# Patient Record
Sex: Male | Born: 2000 | Race: White | Hispanic: No | Marital: Single
Health system: Southern US, Community
[De-identification: ages and names within clinical notes are randomized; demographics above are authoritative.]

## PROBLEM LIST (undated history)
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
## 2019-09-23 DIAGNOSIS — R55 Syncope and collapse: Secondary | ICD-10-CM

## 2019-11-15 ENCOUNTER — Encounter: Payer: Self-pay | Admitting: Neurology

## 2019-11-15 ENCOUNTER — Ambulatory Visit: Payer: Self-pay | Admitting: Neurology

## 2020-01-29 ENCOUNTER — Emergency Department (HOSPITAL_COMMUNITY): Payer: Worker's Compensation

## 2020-01-29 ENCOUNTER — Inpatient Hospital Stay (HOSPITAL_COMMUNITY)
Admission: EM | Admit: 2020-01-29 | Discharge: 2020-01-31 | DRG: 472 | Disposition: A | Discharge status: Home / Self Care | Payer: Worker's Compensation | Attending: Neurological Surgery | Admitting: Neurological Surgery

## 2020-01-29 ENCOUNTER — Other Ambulatory Visit: Payer: Self-pay

## 2020-01-29 DIAGNOSIS — S12500A Unspecified displaced fracture of sixth cervical vertebra, initial encounter for closed fracture: Secondary | ICD-10-CM | POA: Diagnosis present

## 2020-01-29 DIAGNOSIS — S27329A Contusion of lung, unspecified, initial encounter: Secondary | ICD-10-CM | POA: Diagnosis present

## 2020-01-29 DIAGNOSIS — Y92410 Unspecified street and highway as the place of occurrence of the external cause: Secondary | ICD-10-CM

## 2020-01-29 DIAGNOSIS — S12600A Unspecified displaced fracture of seventh cervical vertebra, initial encounter for closed fracture: Principal | ICD-10-CM | POA: Diagnosis present

## 2020-01-29 DIAGNOSIS — M5410 Radiculopathy, site unspecified: Secondary | ICD-10-CM | POA: Diagnosis present

## 2020-01-29 DIAGNOSIS — Z23 Encounter for immunization: Secondary | ICD-10-CM

## 2020-01-29 DIAGNOSIS — S270XXA Traumatic pneumothorax, initial encounter: Secondary | ICD-10-CM | POA: Diagnosis present

## 2020-01-29 DIAGNOSIS — S129XXA Fracture of neck, unspecified, initial encounter: Secondary | ICD-10-CM | POA: Diagnosis present

## 2020-01-29 DIAGNOSIS — Z419 Encounter for procedure for purposes other than remedying health state, unspecified: Secondary | ICD-10-CM

## 2020-01-29 DIAGNOSIS — Z20822 Contact with and (suspected) exposure to covid-19: Secondary | ICD-10-CM | POA: Diagnosis present

## 2020-01-29 DIAGNOSIS — M79601 Pain in right arm: Secondary | ICD-10-CM | POA: Diagnosis present

## 2020-01-29 DIAGNOSIS — S12690A Other displaced fracture of seventh cervical vertebra, initial encounter for closed fracture: Secondary | ICD-10-CM

## 2020-01-29 DIAGNOSIS — M4802 Spinal stenosis, cervical region: Secondary | ICD-10-CM | POA: Diagnosis present

## 2020-01-29 LAB — I-STAT CHEM 8, ED
BUN: 10 mg/dL (ref 6–20)
Calcium, Ion: 1.13 mmol/L — ABNORMAL LOW (ref 1.15–1.40)
Chloride: 107 mmol/L (ref 98–111)
Creatinine, Ser: 0.9 mg/dL (ref 0.61–1.24)
Glucose, Bld: 83 mg/dL (ref 70–99)
HCT: 41 % (ref 39.0–52.0)
Hemoglobin: 13.9 g/dL (ref 13.0–17.0)
Potassium: 3.2 mmol/L — ABNORMAL LOW (ref 3.5–5.1)
Sodium: 140 mmol/L (ref 135–145)
TCO2: 19 mmol/L — ABNORMAL LOW (ref 22–32)

## 2020-01-29 MED ORDER — TETANUS-DIPHTH-ACELL PERTUSSIS 5-2.5-18.5 LF-MCG/0.5 IM SUSP
0.5000 mL | Freq: Once | INTRAMUSCULAR | Status: AC
  Administered 2020-01-29: 0.5 mL via INTRAMUSCULAR
  Filled 2020-01-29: qty 0.5

## 2020-01-29 MED ORDER — IOHEXOL 300 MG/ML  SOLN
100.0000 mL | Freq: Once | INTRAMUSCULAR | Status: AC | PRN
  Administered 2020-01-29: 100 mL via INTRAVENOUS

## 2020-01-29 MED ORDER — MORPHINE SULFATE (PF) 4 MG/ML IV SOLN
4.0000 mg | Freq: Once | INTRAVENOUS | Status: AC
  Administered 2020-01-29: 4 mg via INTRAVENOUS
  Filled 2020-01-29: qty 1

## 2020-01-29 MED ORDER — FENTANYL CITRATE (PF) 100 MCG/2ML IJ SOLN
50.0000 ug | Freq: Once | INTRAMUSCULAR | Status: AC
  Administered 2020-01-29: 50 ug via INTRAVENOUS
  Filled 2020-01-29: qty 2

## 2020-01-29 NOTE — Progress Notes (Signed)
   01/29/20 1900  Clinical Encounter Type  Visited With Patient and family together  Visit Type Social support  Referral From Nurse  Consult/Referral To Chaplain  Spiritual Encounters  Spiritual Needs Emotional  The chaplain responded to trauma page. The patient was having a CT scan. The chaplain spoke with the patient's mother Lawanna Kobus). The mother was nervous and anxious. The chaplain provided social and emotional support until the patient arrived back to the room. The chaplain will follow up as needed
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
## 2020-01-29 NOTE — ED Notes (Signed)
Pt transported to MRI 
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
## 2020-01-30 ENCOUNTER — Inpatient Hospital Stay (HOSPITAL_COMMUNITY): Payer: Worker's Compensation | Admitting: Certified Registered Nurse Anesthetist

## 2020-01-30 ENCOUNTER — Encounter (HOSPITAL_COMMUNITY): Payer: Self-pay | Admitting: Neurological Surgery

## 2020-01-30 ENCOUNTER — Encounter (HOSPITAL_COMMUNITY)
Admission: EM | Disposition: A | Discharge status: Home / Self Care | Payer: Self-pay | Source: Home / Self Care | Attending: Neurological Surgery

## 2020-01-30 ENCOUNTER — Inpatient Hospital Stay (HOSPITAL_COMMUNITY): Payer: Worker's Compensation

## 2020-01-30 DIAGNOSIS — S129XXA Fracture of neck, unspecified, initial encounter: Secondary | ICD-10-CM | POA: Diagnosis present

## 2020-01-30 DIAGNOSIS — M79601 Pain in right arm: Secondary | ICD-10-CM | POA: Diagnosis present

## 2020-01-30 DIAGNOSIS — Z23 Encounter for immunization: Secondary | ICD-10-CM | POA: Diagnosis not present

## 2020-01-30 DIAGNOSIS — S12600A Unspecified displaced fracture of seventh cervical vertebra, initial encounter for closed fracture: Secondary | ICD-10-CM | POA: Diagnosis present

## 2020-01-30 DIAGNOSIS — S12500A Unspecified displaced fracture of sixth cervical vertebra, initial encounter for closed fracture: Secondary | ICD-10-CM | POA: Diagnosis present

## 2020-01-30 DIAGNOSIS — M4802 Spinal stenosis, cervical region: Secondary | ICD-10-CM | POA: Diagnosis present

## 2020-01-30 DIAGNOSIS — S270XXA Traumatic pneumothorax, initial encounter: Secondary | ICD-10-CM | POA: Diagnosis not present

## 2020-01-30 DIAGNOSIS — M5410 Radiculopathy, site unspecified: Secondary | ICD-10-CM | POA: Diagnosis present

## 2020-01-30 DIAGNOSIS — Y92410 Unspecified street and highway as the place of occurrence of the external cause: Secondary | ICD-10-CM | POA: Diagnosis not present

## 2020-01-30 DIAGNOSIS — Z20822 Contact with and (suspected) exposure to covid-19: Secondary | ICD-10-CM | POA: Diagnosis present

## 2020-01-30 DIAGNOSIS — S27329A Contusion of lung, unspecified, initial encounter: Secondary | ICD-10-CM | POA: Diagnosis present

## 2020-01-30 HISTORY — PX: ANTERIOR CERVICAL DECOMP/DISCECTOMY FUSION: SHX1161

## 2020-01-30 LAB — RESPIRATORY PANEL BY RT PCR (FLU A&B, COVID)
Influenza A by PCR: NEGATIVE
Influenza B by PCR: NEGATIVE
SARS Coronavirus 2 by RT PCR: NEGATIVE

## 2020-01-30 SURGERY — ANTERIOR CERVICAL DECOMPRESSION/DISCECTOMY FUSION 2 LEVELS
Anesthesia: General | Site: Neck

## 2020-01-30 MED ORDER — VASOPRESSIN 20 UNIT/ML IV SOLN
INTRAVENOUS | Status: AC
  Filled 2020-01-30: qty 1

## 2020-01-30 MED ORDER — SUGAMMADEX SODIUM 200 MG/2ML IV SOLN
INTRAVENOUS | Status: DC | PRN
  Administered 2020-01-30: 200 mg via INTRAVENOUS

## 2020-01-30 MED ORDER — MENTHOL 3 MG MT LOZG: 1.0000 | LOZENGE | OROMUCOSAL | Status: DC | PRN

## 2020-01-30 MED ORDER — SODIUM CHLORIDE 0.9 % IV SOLN: INTRAVENOUS | Status: DC

## 2020-01-30 MED ORDER — SODIUM CHLORIDE 0.9 % IV SOLN: 250.0000 mL | INTRAVENOUS | Status: DC

## 2020-01-30 MED ORDER — THROMBIN 5000 UNITS EX SOLR
CUTANEOUS | Status: AC
  Filled 2020-01-30: qty 5000

## 2020-01-30 MED ORDER — OXYCODONE HCL 5 MG PO TABS: 5.0000 mg | ORAL_TABLET | ORAL | Status: DC | PRN

## 2020-01-30 MED ORDER — LIDOCAINE-EPINEPHRINE 1 %-1:100000 IJ SOLN
INTRAMUSCULAR | Status: DC | PRN
  Administered 2020-01-30: 5 mL via INTRADERMAL

## 2020-01-30 MED ORDER — FENTANYL CITRATE (PF) 250 MCG/5ML IJ SOLN
INTRAMUSCULAR | Status: DC | PRN
Start: 2020-01-30 — End: 2020-01-30
  Administered 2020-01-30 (×2): 50 ug via INTRAVENOUS

## 2020-01-30 MED ORDER — THROMBIN 5000 UNITS EX SOLR
OROMUCOSAL | Status: DC | PRN
  Administered 2020-01-30: 5 mL via TOPICAL

## 2020-01-30 MED ORDER — PROPOFOL 10 MG/ML IV BOLUS
INTRAVENOUS | Status: DC | PRN
  Administered 2020-01-30: 120 mg via INTRAVENOUS
  Administered 2020-01-30: 30 mg via INTRAVENOUS

## 2020-01-30 MED ORDER — CHLORHEXIDINE GLUCONATE 0.12 % MT SOLN: 15.0000 mL | Freq: Once | OROMUCOSAL | Status: AC

## 2020-01-30 MED ORDER — LIDOCAINE 2% (20 MG/ML) 5 ML SYRINGE
INTRAMUSCULAR | Status: DC | PRN
  Administered 2020-01-30: 40 mg via INTRAVENOUS

## 2020-01-30 MED ORDER — ORAL CARE MOUTH RINSE: 15.0000 mL | Freq: Once | OROMUCOSAL | Status: AC

## 2020-01-30 MED ORDER — MIDAZOLAM HCL 2 MG/2ML IJ SOLN
INTRAMUSCULAR | Status: AC
  Filled 2020-01-30: qty 2

## 2020-01-30 MED ORDER — CYCLOBENZAPRINE HCL 10 MG PO TABS
10.0000 mg | ORAL_TABLET | Freq: Three times a day (TID) | ORAL | Status: DC | PRN
  Administered 2020-01-30 – 2020-01-31 (×2): 10 mg via ORAL
  Filled 2020-01-30 (×2): qty 1

## 2020-01-30 MED ORDER — PHENOL 1.4 % MT LIQD
1.0000 | OROMUCOSAL | Status: DC | PRN
  Administered 2020-01-30: 1 via OROMUCOSAL
  Filled 2020-01-30: qty 177

## 2020-01-30 MED ORDER — ALBUMIN HUMAN 5 % IV SOLN: INTRAVENOUS | Status: DC | PRN

## 2020-01-30 MED ORDER — SODIUM CHLORIDE 0.9% FLUSH: 3.0000 mL | INTRAVENOUS | Status: DC | PRN

## 2020-01-30 MED ORDER — PROPOFOL 10 MG/ML IV BOLUS
INTRAVENOUS | Status: AC
  Filled 2020-01-30: qty 20

## 2020-01-30 MED ORDER — EPHEDRINE SULFATE 50 MG/ML IJ SOLN
INTRAMUSCULAR | Status: DC | PRN
  Administered 2020-01-30: 5 mg via INTRAVENOUS

## 2020-01-30 MED ORDER — LACTATED RINGERS IV SOLN: INTRAVENOUS | Status: DC

## 2020-01-30 MED ORDER — DEXAMETHASONE SODIUM PHOSPHATE 10 MG/ML IJ SOLN
INTRAMUSCULAR | Status: DC | PRN
  Administered 2020-01-30: 10 mg via INTRAVENOUS

## 2020-01-30 MED ORDER — VANCOMYCIN HCL 1000 MG IV SOLR
INTRAVENOUS | Status: DC | PRN
  Administered 2020-01-30: 1000 mg via INTRAVENOUS

## 2020-01-30 MED ORDER — PHENYLEPHRINE 40 MCG/ML (10ML) SYRINGE FOR IV PUSH (FOR BLOOD PRESSURE SUPPORT)
PREFILLED_SYRINGE | INTRAVENOUS | Status: AC
  Filled 2020-01-30: qty 30

## 2020-01-30 MED ORDER — HYDROMORPHONE HCL 1 MG/ML IJ SOLN
0.4000 mg | INTRAMUSCULAR | Status: DC | PRN
  Administered 2020-01-30 – 2020-01-31 (×4): 0.4 mg via INTRAVENOUS
  Filled 2020-01-30 (×4): qty 1

## 2020-01-30 MED ORDER — PHENOL 1.4 % MT LIQD: 1.0000 | OROMUCOSAL | Status: DC | PRN

## 2020-01-30 MED ORDER — ONDANSETRON HCL 4 MG PO TABS: 4.0000 mg | ORAL_TABLET | Freq: Four times a day (QID) | ORAL | Status: DC | PRN

## 2020-01-30 MED ORDER — NOREPINEPHRINE 4 MG/250ML-% IV SOLN: 0.0000 ug/min | INTRAVENOUS | Status: DC

## 2020-01-30 MED ORDER — EPHEDRINE 5 MG/ML INJ
INTRAVENOUS | Status: AC
  Filled 2020-01-30: qty 20

## 2020-01-30 MED ORDER — SODIUM CHLORIDE 0.9% FLUSH
3.0000 mL | Freq: Two times a day (BID) | INTRAVENOUS | Status: DC
  Administered 2020-01-30 (×2): 3 mL via INTRAVENOUS

## 2020-01-30 MED ORDER — ACETAMINOPHEN 650 MG RE SUPP: 650.0000 mg | RECTAL | Status: DC | PRN

## 2020-01-30 MED ORDER — VANCOMYCIN HCL IN DEXTROSE 1-5 GM/200ML-% IV SOLN
INTRAVENOUS | Status: AC
  Filled 2020-01-30: qty 200

## 2020-01-30 MED ORDER — MIDAZOLAM HCL 2 MG/2ML IJ SOLN
INTRAMUSCULAR | Status: DC | PRN
  Administered 2020-01-30: 2 mg via INTRAVENOUS

## 2020-01-30 MED ORDER — OXYCODONE HCL 5 MG PO TABS
10.0000 mg | ORAL_TABLET | ORAL | Status: DC | PRN
  Administered 2020-01-30 – 2020-01-31 (×2): 10 mg via ORAL
  Filled 2020-01-30 (×2): qty 2

## 2020-01-30 MED ORDER — ROCURONIUM BROMIDE 10 MG/ML (PF) SYRINGE
PREFILLED_SYRINGE | INTRAVENOUS | Status: DC | PRN
  Administered 2020-01-30: 20 mg via INTRAVENOUS
  Administered 2020-01-30: 50 mg via INTRAVENOUS

## 2020-01-30 MED ORDER — DEXMEDETOMIDINE (PRECEDEX) IN NS 20 MCG/5ML (4 MCG/ML) IV SYRINGE
PREFILLED_SYRINGE | INTRAVENOUS | Status: DC | PRN
  Administered 2020-01-30: 8 ug via INTRAVENOUS

## 2020-01-30 MED ORDER — HEPARIN SODIUM (PORCINE) 5000 UNIT/ML IJ SOLN: 5000.0000 [IU] | Freq: Three times a day (TID) | INTRAMUSCULAR | Status: DC

## 2020-01-30 MED ORDER — LIDOCAINE-EPINEPHRINE 1 %-1:100000 IJ SOLN
INTRAMUSCULAR | Status: AC
  Filled 2020-01-30: qty 1

## 2020-01-30 MED ORDER — ONDANSETRON HCL 4 MG/2ML IJ SOLN: 4.0000 mg | Freq: Four times a day (QID) | INTRAMUSCULAR | Status: DC | PRN

## 2020-01-30 MED ORDER — DEXMEDETOMIDINE (PRECEDEX) IN NS 20 MCG/5ML (4 MCG/ML) IV SYRINGE
PREFILLED_SYRINGE | INTRAVENOUS | Status: AC
  Filled 2020-01-30: qty 5

## 2020-01-30 MED ORDER — DOCUSATE SODIUM 100 MG PO CAPS
100.0000 mg | ORAL_CAPSULE | Freq: Two times a day (BID) | ORAL | Status: DC
  Administered 2020-01-30 – 2020-01-31 (×3): 100 mg via ORAL
  Filled 2020-01-30 (×3): qty 1

## 2020-01-30 MED ORDER — VASOPRESSIN 20 UNIT/ML IV SOLN
INTRAVENOUS | Status: DC | PRN
  Administered 2020-01-30: 1 [IU] via INTRAVENOUS

## 2020-01-30 MED ORDER — PHENYLEPHRINE HCL (PRESSORS) 10 MG/ML IV SOLN
INTRAVENOUS | Status: DC | PRN
  Administered 2020-01-30: 80 ug via INTRAVENOUS
  Administered 2020-01-30: 160 ug via INTRAVENOUS
  Administered 2020-01-30 (×2): 80 ug via INTRAVENOUS
  Administered 2020-01-30: 160 ug via INTRAVENOUS
  Administered 2020-01-30 (×3): 80 ug via INTRAVENOUS

## 2020-01-30 MED ORDER — ONDANSETRON HCL 4 MG/2ML IJ SOLN
INTRAMUSCULAR | Status: DC | PRN
  Administered 2020-01-30: 4 mg via INTRAVENOUS

## 2020-01-30 MED ORDER — ONDANSETRON HCL 4 MG/2ML IJ SOLN
4.0000 mg | Freq: Four times a day (QID) | INTRAMUSCULAR | Status: DC | PRN
  Administered 2020-01-30 (×2): 4 mg via INTRAVENOUS
  Filled 2020-01-30 (×2): qty 2

## 2020-01-30 MED ORDER — PHENYLEPHRINE HCL-NACL 10-0.9 MG/250ML-% IV SOLN
INTRAVENOUS | Status: DC | PRN
  Administered 2020-01-30: 25 ug/min via INTRAVENOUS

## 2020-01-30 MED ORDER — HYDROMORPHONE HCL 1 MG/ML IJ SOLN
1.0000 mg | INTRAMUSCULAR | Status: DC | PRN
  Administered 2020-01-30 (×2): 1 mg via INTRAVENOUS
  Filled 2020-01-30 (×2): qty 1

## 2020-01-30 MED ORDER — POLYETHYLENE GLYCOL 3350 17 G PO PACK: 17.0000 g | PACK | Freq: Every day | ORAL | Status: DC | PRN

## 2020-01-30 MED ORDER — FENTANYL CITRATE (PF) 250 MCG/5ML IJ SOLN
INTRAMUSCULAR | Status: AC
  Filled 2020-01-30: qty 5

## 2020-01-30 MED ORDER — 0.9 % SODIUM CHLORIDE (POUR BTL) OPTIME
TOPICAL | Status: DC | PRN
  Administered 2020-01-30: 1000 mL

## 2020-01-30 MED ORDER — CHLORHEXIDINE GLUCONATE 0.12 % MT SOLN
OROMUCOSAL | Status: AC
  Administered 2020-01-30: 15 mL via OROMUCOSAL
  Filled 2020-01-30: qty 15

## 2020-01-30 MED ORDER — SODIUM CHLORIDE 0.9% FLUSH: 3.0000 mL | Freq: Two times a day (BID) | INTRAVENOUS | Status: DC

## 2020-01-30 MED ORDER — ACETAMINOPHEN 325 MG PO TABS: 650.0000 mg | ORAL_TABLET | ORAL | Status: DC | PRN

## 2020-01-30 SURGICAL SUPPLY — 58 items
BAG DECANTER FOR FLEXI CONT (MISCELLANEOUS) ×3 IMPLANT
BAND RUBBER #18 3X1/16 STRL (MISCELLANEOUS) ×6 IMPLANT
BLADE CLIPPER SURG (BLADE) IMPLANT
BLADE SURG 11 STRL SS (BLADE) ×3 IMPLANT
BUR MATCHSTICK NEURO 3.0 LAGG (BURR) ×3 IMPLANT
CANISTER SUCT 3000ML PPV (MISCELLANEOUS) ×3 IMPLANT
COVER WAND RF STERILE (DRAPES) ×3 IMPLANT
DECANTER SPIKE VIAL GLASS SM (MISCELLANEOUS) ×3 IMPLANT
DERMABOND ADVANCED (GAUZE/BANDAGES/DRESSINGS) ×2
DERMABOND ADVANCED .7 DNX12 (GAUZE/BANDAGES/DRESSINGS) ×1 IMPLANT
DRAPE C-ARM 42X72 X-RAY (DRAPES) ×6 IMPLANT
DRAPE HALF SHEET 40X57 (DRAPES) IMPLANT
DRAPE LAPAROTOMY 100X72 PEDS (DRAPES) ×3 IMPLANT
DRAPE MICROSCOPE LEICA (MISCELLANEOUS) ×3 IMPLANT
DURAPREP 6ML APPLICATOR 50/CS (WOUND CARE) ×3 IMPLANT
ELECT COATED BLADE 2.86 ST (ELECTRODE) ×3 IMPLANT
ELECT REM PT RETURN 9FT ADLT (ELECTROSURGICAL) ×3
ELECTRODE REM PT RTRN 9FT ADLT (ELECTROSURGICAL) ×1 IMPLANT
GAUZE 4X4 16PLY RFD (DISPOSABLE) IMPLANT
GLOVE BIO SURGEON STRL SZ 6.5 (GLOVE) ×2 IMPLANT
GLOVE BIO SURGEON STRL SZ7.5 (GLOVE) ×3 IMPLANT
GLOVE BIO SURGEONS STRL SZ 6.5 (GLOVE) ×1
GLOVE BIOGEL PI IND STRL 6.5 (GLOVE) ×1 IMPLANT
GLOVE BIOGEL PI IND STRL 7.5 (GLOVE) ×2 IMPLANT
GLOVE BIOGEL PI INDICATOR 6.5 (GLOVE) ×2
GLOVE BIOGEL PI INDICATOR 7.5 (GLOVE) ×4
GLOVE EXAM NITRILE LRG STRL (GLOVE) IMPLANT
GLOVE EXAM NITRILE XL STR (GLOVE) IMPLANT
GLOVE EXAM NITRILE XS STR PU (GLOVE) IMPLANT
GOWN STRL REUS W/ TWL LRG LVL3 (GOWN DISPOSABLE) ×2 IMPLANT
GOWN STRL REUS W/ TWL XL LVL3 (GOWN DISPOSABLE) IMPLANT
GOWN STRL REUS W/TWL 2XL LVL3 (GOWN DISPOSABLE) IMPLANT
GOWN STRL REUS W/TWL LRG LVL3 (GOWN DISPOSABLE) ×4
GOWN STRL REUS W/TWL XL LVL3 (GOWN DISPOSABLE)
HEMOSTAT POWDER KIT SURGIFOAM (HEMOSTASIS) ×3 IMPLANT
KIT BASIN OR (CUSTOM PROCEDURE TRAY) ×3 IMPLANT
KIT TURNOVER KIT B (KITS) ×3 IMPLANT
NEEDLE HYPO 22GX1.5 SAFETY (NEEDLE) ×3 IMPLANT
NEEDLE SPNL 18GX3.5 QUINCKE PK (NEEDLE) ×3 IMPLANT
NS IRRIG 1000ML POUR BTL (IV SOLUTION) ×3 IMPLANT
PACK LAMINECTOMY NEURO (CUSTOM PROCEDURE TRAY) ×3 IMPLANT
PAD ARMBOARD 7.5X6 YLW CONV (MISCELLANEOUS) ×9 IMPLANT
PIN DISTRACTION 14MM (PIN) IMPLANT
PLATE ELITE 21MM (Plate) ×3 IMPLANT
SCREW ST 13X4XST VA NS SPNE (Screw) ×4 IMPLANT
SCREW ST VAR 4 ATL (Screw) ×8 IMPLANT
SPACER BONE CORNERSTONE 6X14 (Orthopedic Implant) ×3 IMPLANT
SPONGE INTESTINAL PEANUT (DISPOSABLE) ×3 IMPLANT
SPONGE SURGIFOAM ABS GEL SZ50 (HEMOSTASIS) IMPLANT
STAPLER VISISTAT 35W (STAPLE) IMPLANT
SUT MNCRL AB 3-0 PS2 18 (SUTURE) IMPLANT
SUT MON AB 3-0 SH 27 (SUTURE) ×2
SUT MON AB 3-0 SH27 (SUTURE) ×1 IMPLANT
SUT VIC AB 3-0 SH 8-18 (SUTURE) ×6 IMPLANT
TAPE CLOTH 3X10 TAN LF (GAUZE/BANDAGES/DRESSINGS) ×3 IMPLANT
TOWEL GREEN STERILE (TOWEL DISPOSABLE) ×3 IMPLANT
TOWEL GREEN STERILE FF (TOWEL DISPOSABLE) ×3 IMPLANT
WATER STERILE IRR 1000ML POUR (IV SOLUTION) ×3 IMPLANT

## 2020-01-30 NOTE — ED Notes (Signed)
MS Breakfast Ordered 

## 2020-01-30 NOTE — Anesthesia Preprocedure Evaluation (Addendum)
Anesthesia Evaluation  Patient identified by MRN, date of birth, ID band Patient awake    Reviewed: Allergy & Precautions, NPO status , Patient's Chart, lab work & pertinent test results  Airway Mallampati: II  TM Distance: >3 FB Neck ROM: Limited    Dental  (+) Teeth Intact, Dental Advisory Given   Pulmonary    breath sounds clear to auscultation       Cardiovascular  Rhythm:Regular Rate:Normal     Neuro/Psych    GI/Hepatic   Endo/Other    Renal/GU      Musculoskeletal   Abdominal   Peds  Hematology   Anesthesia Other Findings   Reproductive/Obstetrics                             Anesthesia Physical Anesthesia Plan  ASA: III and emergent  Anesthesia Plan: General   Post-op Pain Management:    Induction: Intravenous  PONV Risk Score and Plan: Ondansetron and Dexamethasone  Airway Management Planned: Oral ETT and Video Laryngoscope Planned  Additional Equipment:   Intra-op Plan:   Post-operative Plan: Extubation in OR  Informed Consent: I have reviewed the patients History and Physical, chart, labs and discussed the procedure including the risks, benefits and alternatives for the proposed anesthesia with the patient or authorized representative who has indicated his/her understanding and acceptance.     Dental advisory given  Plan Discussed with: CRNA and Anesthesiologist  Anesthesia Plan Comments:        Anesthesia Quick Evaluation

## 2020-01-30 NOTE — Plan of Care (Signed)
Initiate Care Plan Problem: Education: Goal: Knowledge of General Education information will improve Description: Including pain rating scale, medication(s)/side effects and non-pharmacologic comfort measures Outcome: Progressing   Problem: Health Behavior/Discharge Planning: Goal: Ability to manage health-related needs will improve Outcome: Progressing   Problem: Clinical Measurements: Goal: Ability to maintain clinical measurements within normal limits will improve Outcome: Progressing Goal: Will remain free from infection Outcome: Progressing Goal: Diagnostic test results will improve Outcome: Progressing Goal: Respiratory complications will improve Outcome: Progressing Goal: Cardiovascular complication will be avoided Outcome: Progressing   Problem: Activity: Goal: Risk for activity intolerance will decrease Outcome: Progressing   Problem: Nutrition: Goal: Adequate nutrition will be maintained Outcome: Progressing   Problem: Coping: Goal: Level of anxiety will decrease Outcome: Progressing   Problem: Elimination: Goal: Will not experience complications related to bowel motility Outcome: Progressing Goal: Will not experience complications related to urinary retention Outcome: Progressing   Problem: Pain Managment: Goal: General experience of comfort will improve Outcome: Progressing   Problem: Safety: Goal: Ability to remain free from injury will improve Outcome: Progressing   Problem: Skin Integrity: Goal: Risk for impaired skin integrity will decrease Outcome: Progressing   Problem: Education: Goal: Required Educational Video(s) Outcome: Progressing   Problem: Clinical Measurements: Goal: Postoperative complications will be avoided or minimized Outcome: Progressing   Problem: Skin Integrity: Goal: Demonstration of wound healing without infection will improve Outcome: Progressing   
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
## 2020-01-30 NOTE — Anesthesia Postprocedure Evaluation (Signed)
Anesthesia Post Note  Patient: Hydrologist  Procedure(s) Performed: ANTERIOR CERVICAL DISCECTOMY FUSION  Cervical six - Cervical seven (N/A Neck)     Patient location during evaluation: PACU Anesthesia Type: General Level of consciousness: awake and alert Pain management: pain level controlled Vital Signs Assessment: post-procedure vital signs reviewed and stable Respiratory status: spontaneous breathing, nonlabored ventilation, respiratory function stable and patient connected to nasal cannula oxygen Cardiovascular status: blood pressure returned to baseline and stable Postop Assessment: no apparent nausea or vomiting Anesthetic complications: no   No complications documented.  Last Vitals:  Vitals:   01/30/20 1741 01/30/20 1938  BP: (!) 102/50 (!) 109/57  Pulse: (!) 57 71  Resp: 16 18  Temp: 36.8 C 36.8 C  SpO2: 100% 99%    Last Pain:  Vitals:   01/30/20 2055  TempSrc:   PainSc: 9                  Ciji Boston COKER
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
## 2020-01-30 NOTE — Transfer of Care (Signed)
Immediate Anesthesia Transfer of Care Note  Patient: Ronald Quinn  Procedure(s) Performed: ANTERIOR CERVICAL DISCECTOMY FUSION  Cervical six - Cervical seven (N/A Neck)  Patient Location: PACU  Anesthesia Type:General  Level of Consciousness: drowsy  Airway & Oxygen Therapy: Patient Spontanous Breathing  Post-op Assessment: Report given to RN and Post -op Vital signs reviewed and stable  Post vital signs: Reviewed and stable  Last Vitals:  Vitals Value Taken Time  BP 89/47 01/30/20 1444  Temp    Pulse 84 01/30/20 1446  Resp 11 01/30/20 1446  SpO2 99 % 01/30/20 1446  Vitals shown include unvalidated device data.  Last Pain:  Vitals:   01/30/20 0805  TempSrc:   PainSc: 8          Complications: No complications documented.

## 2020-01-31 MED ORDER — NICOTINE 14 MG/24HR TD PT24
14.0000 mg | MEDICATED_PATCH | Freq: Every day | TRANSDERMAL | Status: DC
  Administered 2020-01-31: 14 mg via TRANSDERMAL
  Filled 2020-01-31 (×2): qty 1

## 2020-01-31 MED ORDER — CYCLOBENZAPRINE HCL 10 MG PO TABS: 10.0000 mg | ORAL_TABLET | Freq: Three times a day (TID) | ORAL | 0 refills | Status: AC | PRN

## 2020-01-31 MED ORDER — OXYCODONE HCL 5 MG PO TABS
5.0000 mg | ORAL_TABLET | ORAL | 0 refills | Status: AC | PRN
Start: 2020-01-31 — End: ?
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
## 2020-01-31 NOTE — Plan of Care (Signed)
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
## 2020-01-31 NOTE — Progress Notes (Signed)
Patient sitting in chair, awaiting discharge instructions, patient has already arranged for transport and they are on their way.  Pain medication was administered prior to discharge. (See MAR)

## 2020-01-31 NOTE — Progress Notes (Signed)
Dilaudid 0.4 mL = 0.4 mg of 1 mg/mL   0.6 mg wasted with Dianna Rossetti, AD
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
## 2020-01-31 NOTE — Discharge Instructions (Signed)
Discharge Instructions ° °No restriction in activities, slowly increase your activity back to normal.  ° °Your incision is closed with dermabond (purple glue). This will naturally fall off over the next 1-2 weeks.  ° °Okay to shower on the day of discharge. Use regular soap and water and try to be gentle when cleaning your incision.  ° °Follow up with Dr. Lacee Grey in 2 weeks after discharge. If you do not already have a discharge appointment, please call his office at [phone] to schedule a follow up appointment. If you have any concerns or questions, please call the office and let us know. °

## 2020-01-31 NOTE — Discharge Summary (Signed)
Discharge Summary  Date of Admission: 01/29/2020  Date of Discharge: 01/31/20  Attending Physician: Autumn Patty, MD  Hospital Course: Patient presented with neck pain and RUE numbness / weakness, CT and MR showed a fracture at C6 with C6-7 foraminal stenosis due to disc material and fracture fragments. He was taken to the OR for an uncomplicated C6-7 ACDF, recovered in PACU and transferred to a regular nursing floor. His symptoms and neurologic function began improving immediately after surgery, his hospital course was uncomplicated and the patient was discharged home on 01/31/20. He will follow up in clinic with me in 2 weeks.  Neurologic exam at discharge:  AOx3, PERRL, EOMI, FS, TM Strength 5/5 x4 except pain limited in the proximal RUE and grip 4/5 with R C7 improving numbness, sensation otherwise intact  Discharge diagnosis: Closed fracture of the cervical spine  Jadene Pierini, MD 01/31/20 11:09 AM
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
## 2020-02-01 ENCOUNTER — Encounter (HOSPITAL_COMMUNITY): Payer: Self-pay | Admitting: Neurological Surgery

## 2021-09-05 IMAGING — DX DG ELBOW 2V*R*
2 series · 2 of 2 positions shown · non-contrast
Comparison: None.

CLINICAL DATA: Trauma

EXAM:
RIGHT ELBOW - 2 VIEW

[elbow ap]
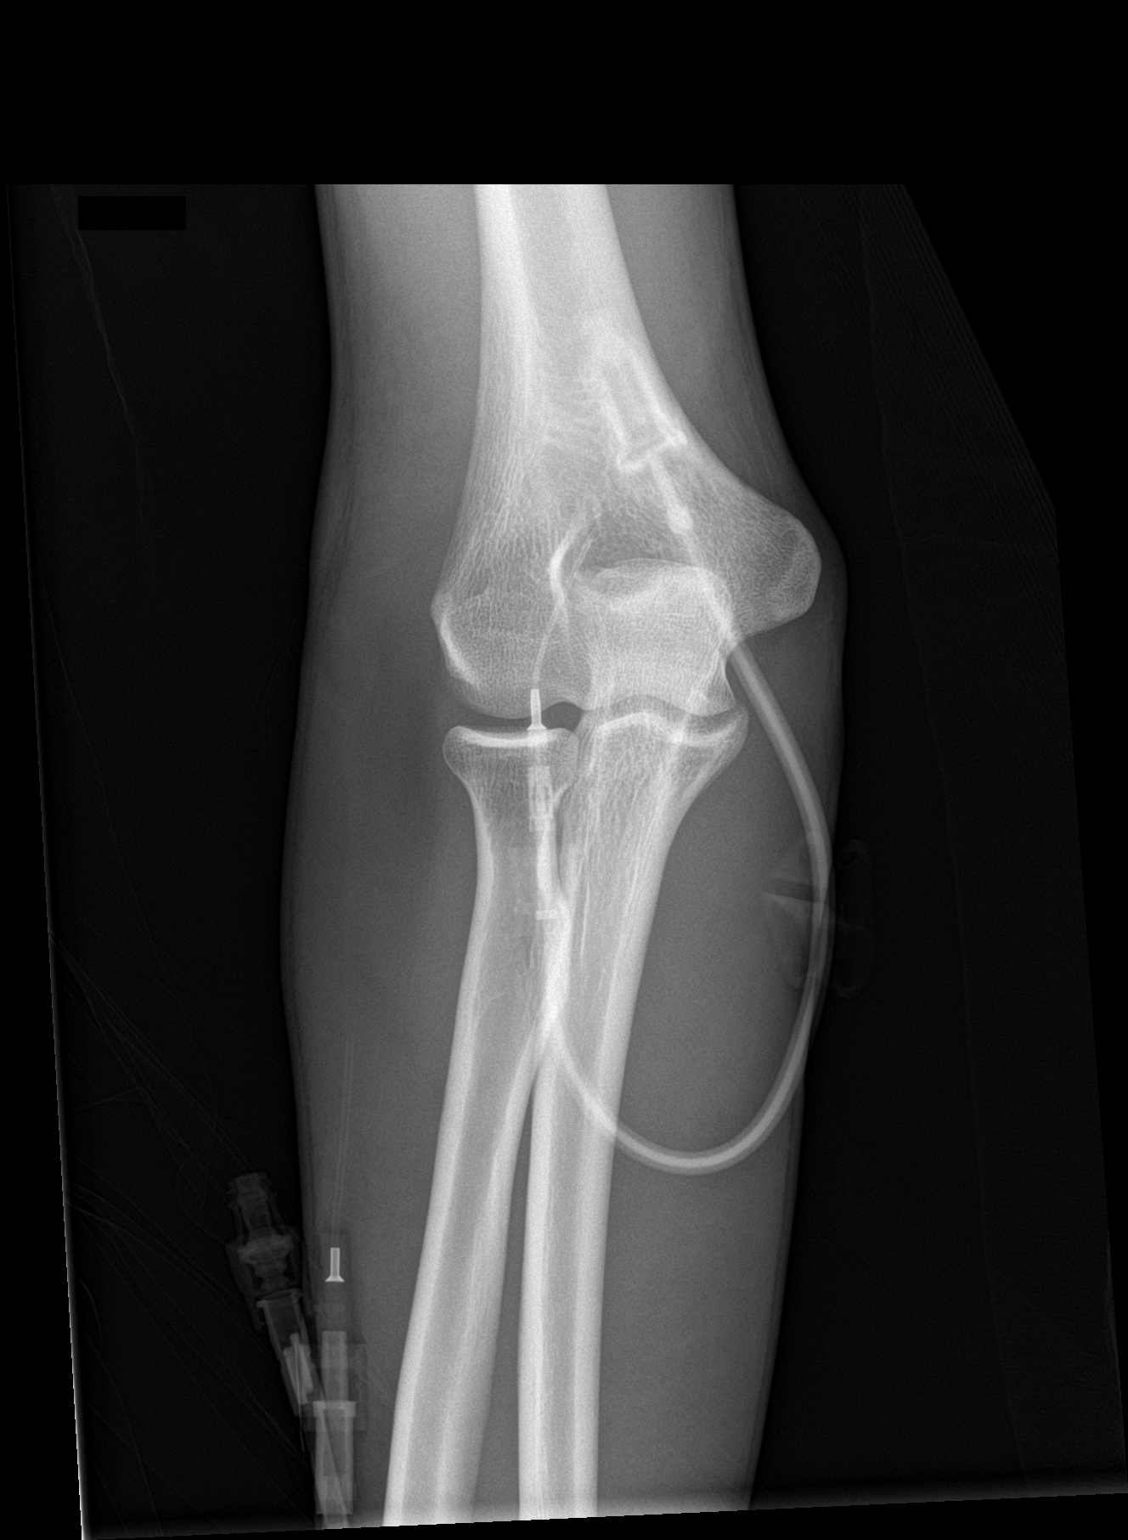

[elbow lat]
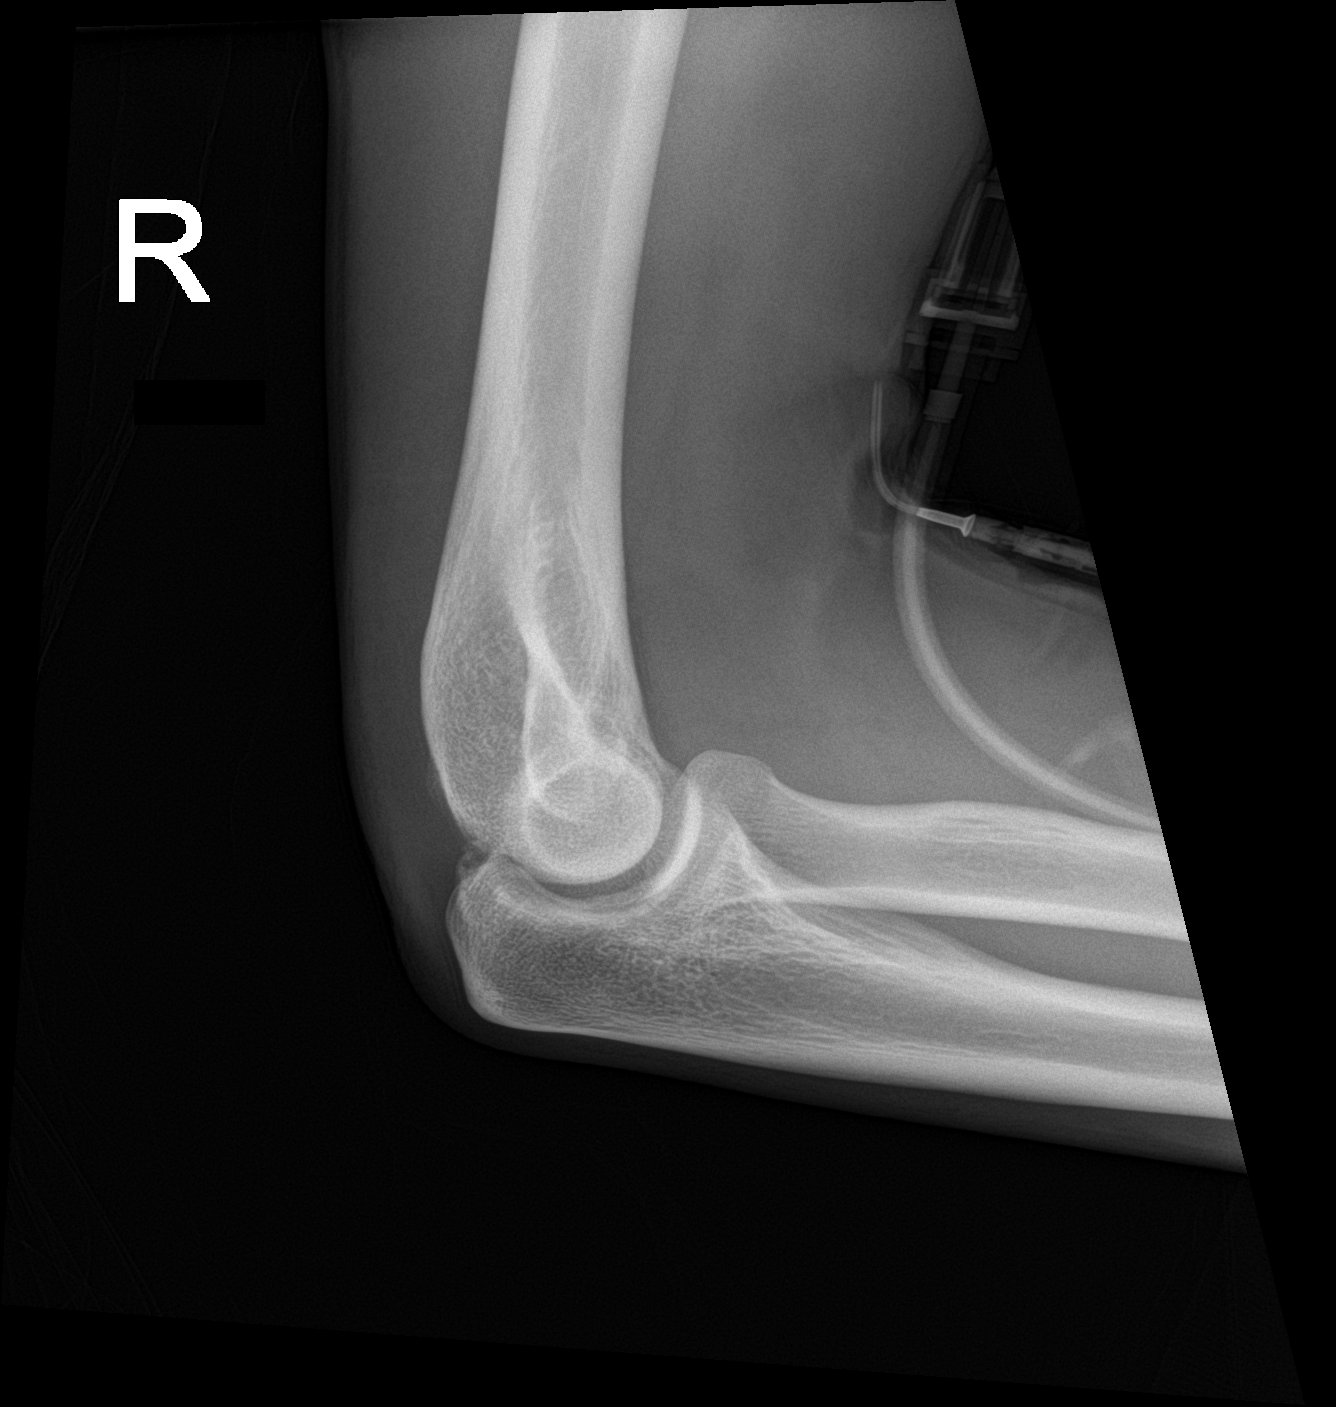

[2 of 2 positions shown; findings below may reference images not displayed]

FINDINGS: No acute fracture or dislocation. Joint spaces and alignment are
maintained. No area of erosion or osseous destruction. No unexpected
radiopaque foreign body. AC fossa venous catheter. Soft tissues are
unremarkable.
IMPRESSION: No acute fracture or dislocation.

## 2021-09-06 IMAGING — RF DG CERVICAL SPINE 1V
1 series · 2 of 2 positions shown · non-contrast
Comparison: None.

CLINICAL DATA: Anterior cervical discectomy and fusion procedure,
intraoperative examination.

EXAM:
DG CERVICAL SPINE - 1 VIEW; DG C-ARM 1-60 MIN

[Series 1: run · 2 of 2 slices shown]
[im 1/2]
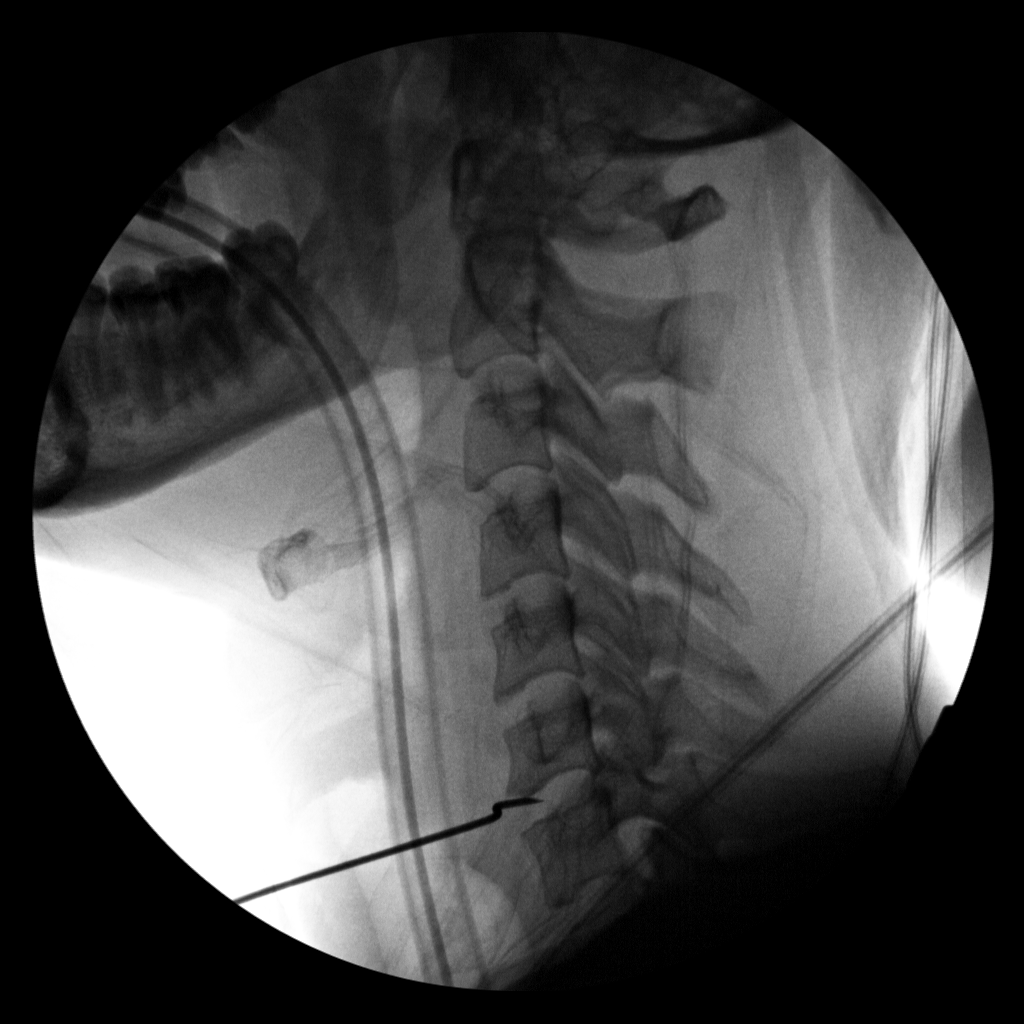
[im 2/2]
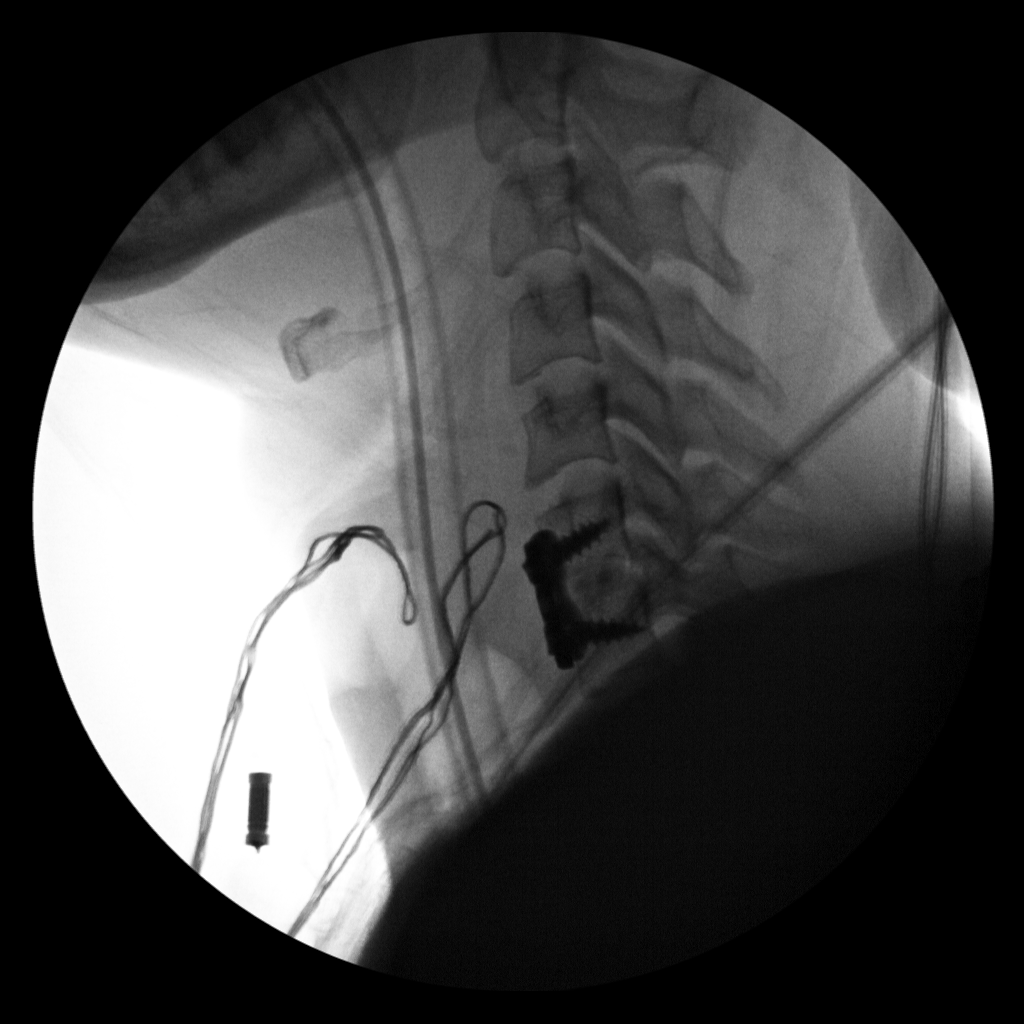

[2 of 2 positions shown; findings below may reference images not displayed]

FINDINGS: Single cross-table lateral intraoperative radiograph of the cervical
spine demonstrates cervical spine from the occiput through C7-T1. A
metallic marker is seen overlying the a anterior aspect of the
intervertebral disc space of C6-7. Endotracheal tube obscures the
prevertebral soft tissues. There is straightening of the cervical
spine, likely positional in nature. No definite fracture or
listhesis on this limited examination.

FLUOROSCOPY TIME:  12 seconds
IMPRESSION: Metallic marker at C6-7.

## 2021-09-06 IMAGING — RF DG C-ARM 1-60 MIN
1 series · 2 of 2 positions shown · non-contrast
Comparison: None.

CLINICAL DATA: Anterior cervical discectomy and fusion procedure,
intraoperative examination.

EXAM:
DG CERVICAL SPINE - 1 VIEW; DG C-ARM 1-60 MIN

[Series 1: run · 2 of 2 slices shown]
[im 1/2]
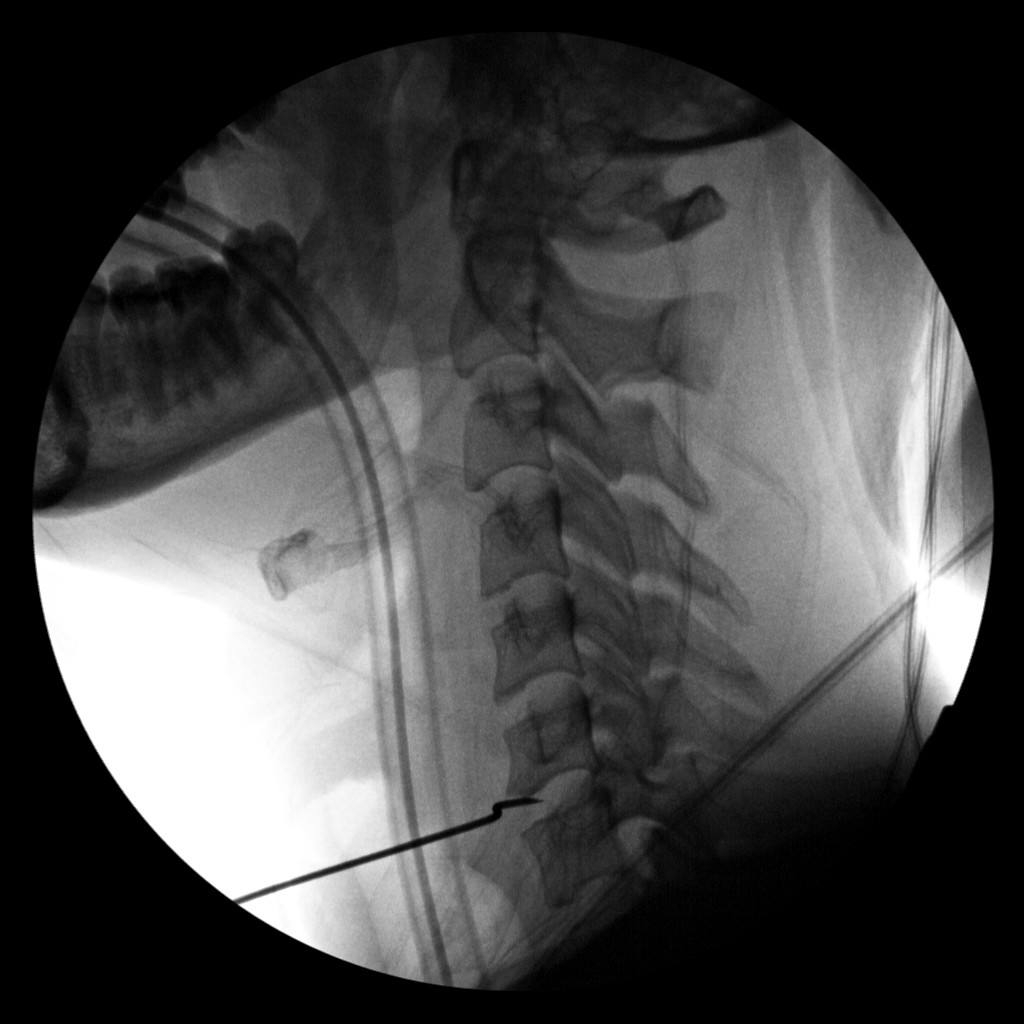
[im 2/2]
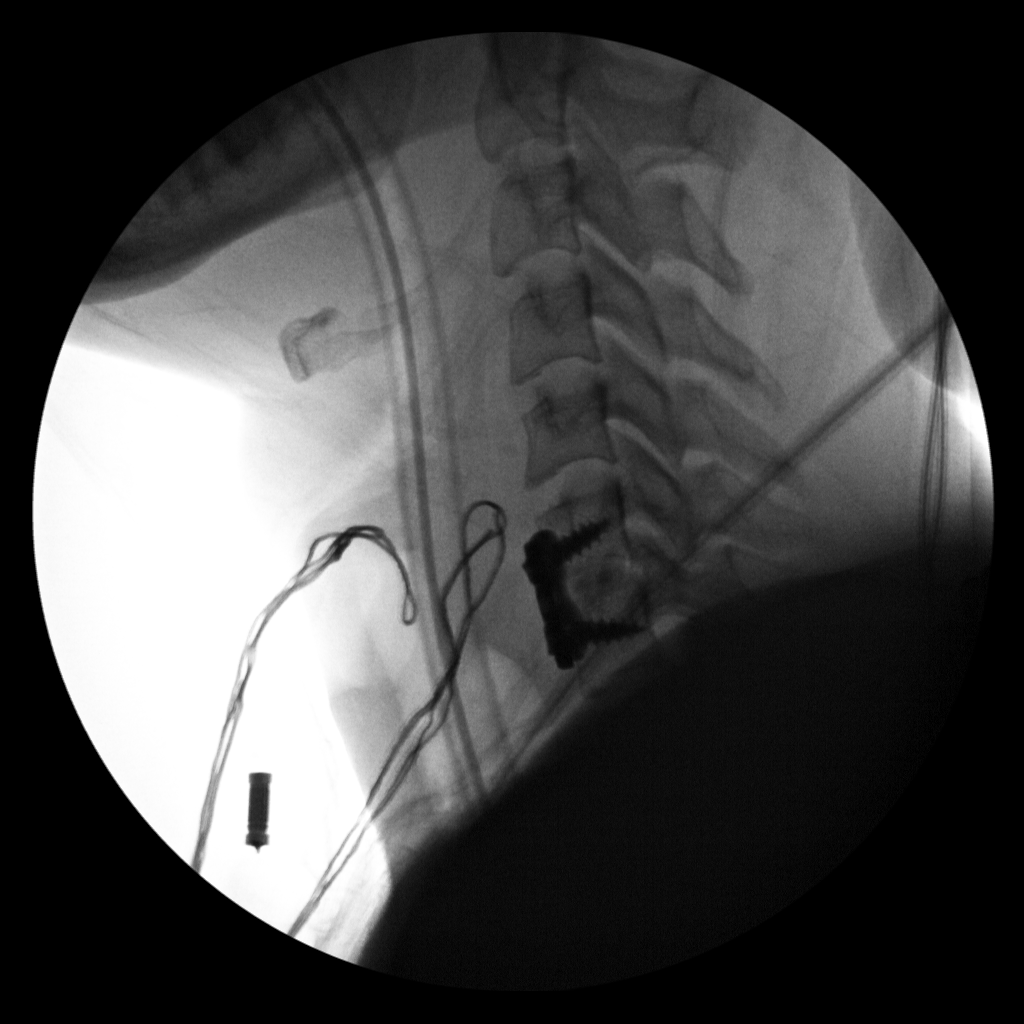

[2 of 2 positions shown; findings below may reference images not displayed]

FINDINGS: Single cross-table lateral intraoperative radiograph of the cervical
spine demonstrates cervical spine from the occiput through C7-T1. A
metallic marker is seen overlying the a anterior aspect of the
intervertebral disc space of C6-7. Endotracheal tube obscures the
prevertebral soft tissues. There is straightening of the cervical
spine, likely positional in nature. No definite fracture or
listhesis on this limited examination.

FLUOROSCOPY TIME:  12 seconds
IMPRESSION: Metallic marker at C6-7.
